# Patient Record
Sex: Female | Born: 1995 | Race: Black or African American | Hispanic: No | Marital: Single | Smoking: Current some day smoker
Health system: Southern US, Community
[De-identification: ages and names within clinical notes are randomized; demographics above are authoritative.]

## PROBLEM LIST (undated history)

## (undated) ENCOUNTER — Inpatient Hospital Stay (HOSPITAL_COMMUNITY): Payer: Self-pay

## (undated) DIAGNOSIS — N760 Acute vaginitis: Secondary | ICD-10-CM

## (undated) DIAGNOSIS — B9689 Other specified bacterial agents as the cause of diseases classified elsewhere: Secondary | ICD-10-CM

## (undated) DIAGNOSIS — Z8619 Personal history of other infectious and parasitic diseases: Secondary | ICD-10-CM

## (undated) HISTORY — PX: WISDOM TOOTH EXTRACTION: SHX21

---
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
## 2014-05-13 ENCOUNTER — Encounter (HOSPITAL_COMMUNITY): Payer: Self-pay

## 2014-05-13 ENCOUNTER — Inpatient Hospital Stay (HOSPITAL_COMMUNITY): Payer: BC Managed Care – PPO

## 2014-05-13 ENCOUNTER — Inpatient Hospital Stay (HOSPITAL_COMMUNITY)
Admission: AD | Admit: 2014-05-13 | Discharge: 2014-05-13 | Disposition: A | Discharge status: Home / Self Care | Payer: BC Managed Care – PPO | Source: Ambulatory Visit | Attending: Obstetrics and Gynecology | Admitting: Obstetrics and Gynecology

## 2014-05-13 DIAGNOSIS — O074 Failed attempted termination of pregnancy without complication: Secondary | ICD-10-CM | POA: Diagnosis not present

## 2014-05-13 DIAGNOSIS — N912 Amenorrhea, unspecified: Secondary | ICD-10-CM | POA: Diagnosis present

## 2014-05-13 DIAGNOSIS — Z3492 Encounter for supervision of normal pregnancy, unspecified, second trimester: Secondary | ICD-10-CM

## 2014-05-13 DIAGNOSIS — F17200 Nicotine dependence, unspecified, uncomplicated: Secondary | ICD-10-CM | POA: Insufficient documentation

## 2014-05-13 DIAGNOSIS — Z9889 Other specified postprocedural states: Secondary | ICD-10-CM

## 2014-05-13 LAB — HIV ANTIBODY (ROUTINE TESTING W REFLEX): HIV: NONREACTIVE

## 2014-05-13 LAB — WET PREP, GENITAL
Clue Cells Wet Prep HPF POC: NONE SEEN
TRICH WET PREP: NONE SEEN
YEAST WET PREP: NONE SEEN

## 2014-05-13 LAB — POCT PREGNANCY, URINE: Preg Test, Ur: POSITIVE — AB

## 2014-05-13 LAB — HCG, QUANTITATIVE, PREGNANCY: HCG, BETA CHAIN, QUANT, S: 48102 m[IU]/mL — AB (ref <5)

## 2014-05-13 LAB — RPR

## 2014-05-13 NOTE — MAU Note (Signed)
Pt thinks her TAB in July was unsuccessful and that this is the same pregnancy.

## 2014-05-13 NOTE — MAU Provider Note (Signed)
CSN: [id number]     Arrival date & time 05/13/14  1130 History   None    Chief Complaint  Patient presents with  . abortion complication      (Consider location/radiation/quality/duration/timing/severity/associated sxs/prior Treatment) The history is provided by the patient.   Alison Jensen is a 18 y.o. G2P0010 who presents to the ED with amenorrhea. She states that she went to an abortion clinic in [facility] July 17 and had a TAB when she was [redacted] weeks pregnant. She had bleeding after that x 2 days but has had nothing since then. She reports being sexually active since the procedure. She started OC's about 4 weeks after the procedure but only took them for a week. She decided to do a pregnancy test last week and it was positive. She went back to the abortion clinic last Friday after she had the positive pregnancy and they told her she could not continue with this pregnancy and scheduled her for a repeat TAB for tomorrow. The patient states she is confused about what they found and wants to know what is going on.  Current sex partner x 4 years. Hx of Chlamydia 2 years ago and then again this year. Last pap smear 8 months ago and was normal.   Past Medical History  Diagnosis Date  . BV (bacterial vaginosis)   . Medical history non-contributory    Past Surgical History  Procedure Laterality Date  . No past surgeries     History reviewed. No pertinent family history. History  Substance Use Topics  . Smoking status: Current Some Day Smoker -- 0.50 packs/day for 4 years    Types: Cigarettes  . Smokeless tobacco: Never Used  . Alcohol Use: No   OB History   Grav Para Term Preterm Abortions TAB SAB Ect Mult Living   Review of Systems  Constitutional: Negative for fever and chills.  HENT: Negative.   Eyes: Negative for visual disturbance.  Respiratory: Negative for cough and shortness of breath.   Cardiovascular: Negative for chest pain.  Gastrointestinal: Positive for  nausea and vomiting. Negative for abdominal pain.  Genitourinary: Positive for frequency. Negative for dysuria, urgency and pelvic pain.  Musculoskeletal: Negative for back pain.  Skin: Negative for rash.  Neurological: Negative for syncope and headaches.  Psychiatric/Behavioral: Negative for confusion. The patient is not nervous/anxious.     Allergies  Review of patient's allergies indicates no known allergies.  Home Medications   Prior to Admission medications   Not on File   BP 121/73  Pulse 85  Temp(Src) 98.5 F (36.9 C) (Oral)  Resp 16  Ht  (1.651 m)  Wt 194 lb 3.2 oz (88.089 kg)  BMI 32.32 kg/m2 Physical Exam  Nursing note and vitals reviewed. Constitutional: She is oriented to person, place, and time. She appears well-developed and well-nourished.  HENT:  Head: Normocephalic.  Eyes: EOM are normal.  Neck: Neck supple.  Cardiovascular: Normal rate.   Pulmonary/Chest: Effort normal.  Abdominal: Soft. There is no tenderness.  Genitourinary:  External genitalia without lesions. Mucous discharge vaginal vault. Cervix long, closed, no CMT, no adnexal tenderness, uterus approximately 12 week size.   Musculoskeletal: Normal range of motion.  Neurological: She is alert and oriented to person, place, and time. No cranial nerve deficit.  Skin: Skin is warm and dry.  Psychiatric: She has a normal mood and affect.       CLINICAL  DATA: Positive fetal heart tones. TAB in July. Gravida 2  para 1.  EXAM:  OBSTETRIC <14 WK ULTRASOUND  TECHNIQUE:  Transabdominal ultrasound was performed for evaluation of the  gestation as well as the maternal uterus and adnexal regions.  COMPARISON: None.  FINDINGS:  Intrauterine gestational sac: Present  Yolk sac: Not seen  Embryo: Present  Cardiac Activity: Present  Heart Rate: 141 bpm  CRL: 77.1 mm 13 w 5d [place] EDC: 11/13/2014  Maternal uterus/adnexae: No subchorionic hemorrhage identified. The  ovaries are not seen. No adnexal  mass or free pelvic fluid.  IMPRESSION:  1. Single living intrauterine embryo at 13 weeks 5 days by  ultrasound.  2. By today's exam EDC is 11/13/2014.  Electronically Signed  By: Rosalie Gums M.D.  On: 05/13/2014 15:41     ED Course  Procedures (including critical care time) Labs Review Labs Reviewed  WET PREP, GENITAL - Abnormal; Notable for the following:    WBC, Wet Prep HPF POC FEW (*)    All other components within normal limits  HCG, QUANTITATIVE, PREGNANCY - Abnormal; Notable for the following:    hCG, Beta Chain, Quant, S 48102 (*)    All other components within normal limits  POCT PREGNANCY, URINE - Abnormal; Notable for the following:    Preg Test, Ur POSITIVE (*)    All other components within normal limits  GC/CHLAMYDIA PROBE AMP  HIV ANTIBODY (ROUTINE TESTING)  RPR    MDM  18 y.o. female with amenorrhea s/p TAB in July with positive FHT's today. Ultrasound pending.  Care turned over to Jeani Sow, NP.  ASSESSMENT:  Viable Intrauterine Pregnancy [redacted]w[redacted]d gestation                             Post unsuccessful TAB  PLAN:  U/S report reviewed with the patient              Instructed to take Prenatal Vitamins qd and begin prenatal care with MD of her choice

## 2014-05-13 NOTE — MAU Note (Signed)
Pt states was 6.5 weeks when she had TAB 03/18/2014. Never had menstrual cycle since TAB. Took home pregnancy test and went to pregnancy clinic, then went back to abortion clinic and was told she is still pregnant with same pregnancy. Scheduled to go tomorrow to repeat procedure, however is unsure if clinic is telling her correct information.

## 2014-05-13 NOTE — MAU Note (Signed)
TAB on 7/17, no period since then.  Pos HPT & UPT [facility].  Pt is scheduled tomorrow for repeat TAB at same abortion center.  Pt denies pain or bleeding.

## 2014-05-13 NOTE — Discharge Instructions (Signed)
Second Trimester of Pregnancy The second trimester is from week 13 through week 28, month 4 through 6. This is often the time in pregnancy that you feel your best. Often times, morning sickness has lessened or quit. You may have more energy, and you may get hungry more often. Your unborn baby (fetus) is growing rapidly. At the end of the sixth month, he or she is about 9 inches long and weighs about 1 pounds. You will likely feel the baby move (quickening) between 18 and 20 weeks of pregnancy. HOME CARE   Avoid all smoking, herbs, and alcohol. Avoid drugs not approved by your doctor.  Only take medicine as told by your doctor. Some medicines are safe and some are not during pregnancy.  Exercise only as told by your doctor. Stop exercising if you start having cramps.  Eat regular, healthy meals.  Wear a good support bra if your breasts are tender.  Do not use hot tubs, steam rooms, or saunas.  Wear your seat belt when driving.  Avoid raw meat, uncooked cheese, and liter boxes and soil used by cats.  Take your prenatal vitamins.  Try taking medicine that helps you poop (stool softener) as needed, and if your doctor approves. Eat more fiber by eating fresh fruit, vegetables, and whole grains. Drink enough fluids to keep your pee (urine) clear or pale yellow.  Take warm water baths (sitz baths) to soothe pain or discomfort caused by hemorrhoids. Use hemorrhoid cream if your doctor approves.  If you have puffy, bulging veins (varicose veins), wear support hose. Raise (elevate) your feet for 15 minutes, 3-4 times a day. Limit salt in your diet.  Avoid heavy lifting, wear low heals, and sit up straight.  Rest with your legs raised if you have leg cramps or low back pain.  Visit your dentist if you have not gone during your pregnancy. Use a soft toothbrush to brush your teeth. Be gentle when you floss.  You can have sex (intercourse) unless your doctor tells you not to.  Go to your  doctor visits. GET HELP IF:   You feel dizzy.  You have mild cramps or pressure in your lower belly (abdomen).  You have a nagging pain in your belly area.  You continue to feel sick to your stomach (nauseous), throw up (vomit), or have watery poop (diarrhea).  You have bad smelling fluid coming from your vagina.  You have pain with peeing (urination). GET HELP RIGHT AWAY IF:   You have a fever.  You are leaking fluid from your vagina.  You have spotting or bleeding from your vagina.  You have severe belly cramping or pain.  You lose or gain weight rapidly.  You have trouble catching your breath and have chest pain.  You notice sudden or extreme puffiness (swelling) of your face, hands, ankles, feet, or legs.  You have not felt the baby move in over an hour.  You have severe headaches that do not go away with medicine.  You have vision changes. Document Released: 11/13/2009 Document Revised: 12/14/2012 Document Reviewed: 10/20/2012 Exit[employer] Patient Information 2015 [employer]. This information is not intended to replace advice given to you by your health care provider. Make sure you discuss any questions you have with your health care provider.  

## 2014-05-14 LAB — GC/CHLAMYDIA PROBE AMP
CT Probe RNA: NEGATIVE
GC PROBE AMP APTIMA: NEGATIVE

## 2014-05-17 NOTE — MAU Provider Note (Signed)
Attestation of Attending Supervision of Advanced Practitioner (CNM/NP): Evaluation and management procedures were performed by the Advanced Practitioner under my supervision and collaboration.  I have reviewed the Advanced Practitioner's note and chart, and I agree with the management and plan.  Lawrencia Mauney 05/17/2014 9:29 AM

## 2014-07-04 ENCOUNTER — Encounter (HOSPITAL_COMMUNITY): Payer: Self-pay

## 2014-09-29 ENCOUNTER — Emergency Department (HOSPITAL_COMMUNITY): Payer: BLUE CROSS/BLUE SHIELD

## 2014-09-29 ENCOUNTER — Emergency Department (HOSPITAL_COMMUNITY)
Admission: EM | Admit: 2014-09-29 | Discharge: 2014-09-29 | Disposition: A | Discharge status: Home / Self Care | Payer: BLUE CROSS/BLUE SHIELD | Attending: Emergency Medicine | Admitting: Emergency Medicine

## 2014-09-29 ENCOUNTER — Encounter (HOSPITAL_COMMUNITY): Payer: Self-pay

## 2014-09-29 DIAGNOSIS — M25571 Pain in right ankle and joints of right foot: Secondary | ICD-10-CM | POA: Diagnosis not present

## 2014-09-29 DIAGNOSIS — R000 Tachycardia, unspecified: Secondary | ICD-10-CM | POA: Diagnosis not present

## 2014-09-29 DIAGNOSIS — M722 Plantar fascial fibromatosis: Secondary | ICD-10-CM | POA: Diagnosis not present

## 2014-09-29 DIAGNOSIS — Z720 Tobacco use: Secondary | ICD-10-CM | POA: Insufficient documentation

## 2014-09-29 DIAGNOSIS — M2141 Flat foot [pes planus] (acquired), right foot: Secondary | ICD-10-CM | POA: Insufficient documentation

## 2014-09-29 DIAGNOSIS — Z8742 Personal history of other diseases of the female genital tract: Secondary | ICD-10-CM | POA: Diagnosis not present

## 2014-09-29 DIAGNOSIS — R2241 Localized swelling, mass and lump, right lower limb: Secondary | ICD-10-CM | POA: Diagnosis present

## 2014-09-29 MED ORDER — NAPROXEN 375 MG PO TABS: 375.0000 mg | ORAL_TABLET | Freq: Two times a day (BID) | ORAL | Status: DC

## 2014-09-29 MED ORDER — IBUPROFEN 400 MG PO TABS
800.0000 mg | ORAL_TABLET | Freq: Once | ORAL | Status: AC
  Administered 2014-09-29: 800 mg via ORAL
  Filled 2014-09-29: qty 2
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
## 2014-09-29 NOTE — ED Notes (Signed)
Pt c/o R foot pain since this morning; denies injury. Pain increases when bearing weight. Reports wearing "baby doll shoes" to a meeting this morning and noticed some discomfort to R foot. Swelling noted to R foot and ankle

## 2014-09-29 NOTE — Discharge Instructions (Signed)
Plantar Fasciitis (Heel Spur Syndrome) with Rehab The plantar fascia is a fibrous, ligament-like, soft-tissue structure that spans the bottom of the foot. Plantar fasciitis is a condition that causes pain in the foot due to inflammation of the tissue. SYMPTOMS   Pain and tenderness on the underneath side of the foot.  Pain that worsens with standing or walking. CAUSES  Plantar fasciitis is caused by irritation and injury to the plantar fascia on the underneath side of the foot. Common mechanisms of injury include:  Direct trauma to bottom of the foot.  Damage to a small nerve that runs under the foot where the main fascia attaches to the heel bone.  Stress placed on the plantar fascia due to bone spurs. RISK INCREASES WITH:   Activities that place stress on the plantar fascia (running, jumping, pivoting, or cutting).  Poor strength and flexibility.  Improperly fitted shoes.  Tight calf muscles.  Flat feet.  Failure to warm-up properly before activity.  Obesity. PREVENTION  Warm up and stretch properly before activity.  Allow for adequate recovery between workouts.  Maintain physical fitness:  Strength, flexibility, and endurance.  Cardiovascular fitness.  Maintain a health body weight.  Avoid stress on the plantar fascia.  Wear properly fitted shoes, including arch supports for individuals who have flat feet. PROGNOSIS  If treated properly, then the symptoms of plantar fasciitis usually resolve without surgery. However, occasionally surgery is necessary. RELATED COMPLICATIONS   Recurrent symptoms that may result in a chronic condition.  Problems of the lower back that are caused by compensating for the injury, such as limping.  Pain or weakness of the foot during push-off following surgery.  Chronic inflammation, scarring, and partial or complete fascia tear, occurring more often from repeated injections. TREATMENT  Treatment initially involves the use of  ice and medication to help reduce pain and inflammation. The use of strengthening and stretching exercises may help reduce pain with activity, especially stretches of the Achilles tendon. These exercises may be performed at home or with a therapist. Your caregiver may recommend that you use heel cups of arch supports to help reduce stress on the plantar fascia. Occasionally, corticosteroid injections are given to reduce inflammation. If symptoms persist for greater than 6 months despite non-surgical (conservative), then surgery may be recommended.  MEDICATION   If pain medication is necessary, then nonsteroidal anti-inflammatory medications, such as aspirin and ibuprofen, or other minor pain relievers, such as acetaminophen, are often recommended.  Do not take pain medication within 7 days before surgery.  Prescription pain relievers may be given if deemed necessary by your caregiver. Use only as directed and only as much as you need.  Corticosteroid injections may be given by your caregiver. These injections should be reserved for the most serious cases, because they may only be given a certain number of times. HEAT AND COLD  Cold treatment (icing) relieves pain and reduces inflammation. Cold treatment should be applied for 10 to 15 minutes every 2 to 3 hours for inflammation and pain and immediately after any activity that aggravates your symptoms. Use ice packs or massage the area with a piece of ice (ice massage).  Heat treatment may be used prior to performing the stretching and strengthening activities prescribed by your caregiver, physical therapist, or athletic trainer. Use a heat pack or soak the injury in warm water. SEEK IMMEDIATE MEDICAL CARE IF:  Treatment seems to offer no benefit, or the condition worsens.  Any medications produce adverse side effects. EXERCISES RANGE   OF MOTION (ROM) AND STRETCHING EXERCISES - Plantar Fasciitis (Heel Spur Syndrome) These exercises may help you  when beginning to rehabilitate your injury. Your symptoms may resolve with or without further involvement from your physician, physical therapist or athletic trainer. While completing these exercises, remember:   Restoring tissue flexibility helps normal motion to return to the joints. This allows healthier, less painful movement and activity.  An effective stretch should be held for at least 30 seconds.  A stretch should never be painful. You should only feel a gentle lengthening or release in the stretched tissue. RANGE OF MOTION - Toe Extension, Flexion  Sit with your right / left leg crossed over your opposite knee.  Grasp your toes and gently pull them back toward the top of your foot. You should feel a stretch on the bottom of your toes and/or foot.  Hold this stretch for __________ seconds.  Now, gently pull your toes toward the bottom of your foot. You should feel a stretch on the top of your toes and or foot.  Hold this stretch for __________ seconds. Repeat __________ times. Complete this stretch __________ times per day.  RANGE OF MOTION - Ankle Dorsiflexion, Active Assisted  Remove shoes and sit on a chair that is preferably not on a carpeted surface.  Place right / left foot under knee. Extend your opposite leg for support.  Keeping your heel down, slide your right / left foot back toward the chair until you feel a stretch at your ankle or calf. If you do not feel a stretch, slide your bottom forward to the edge of the chair, while still keeping your heel down.  Hold this stretch for __________ seconds. Repeat __________ times. Complete this stretch __________ times per day.  STRETCH - Gastroc, Standing  Place hands on wall.  Extend right / left leg, keeping the front knee somewhat bent.  Slightly point your toes inward on your back foot.  Keeping your right / left heel on the floor and your knee straight, shift your weight toward the wall, not allowing your back to  arch.  You should feel a gentle stretch in the right / left calf. Hold this position for __________ seconds. Repeat __________ times. Complete this stretch __________ times per day. STRETCH - Soleus, Standing  Place hands on wall.  Extend right / left leg, keeping the other knee somewhat bent.  Slightly point your toes inward on your back foot.  Keep your right / left heel on the floor, bend your back knee, and slightly shift your weight over the back leg so that you feel a gentle stretch deep in your back calf.  Hold this position for __________ seconds. Repeat __________ times. Complete this stretch __________ times per day. STRETCH - Gastrocsoleus, Standing  Note: This exercise can place a lot of stress on your foot and ankle. Please complete this exercise only if specifically instructed by your caregiver.   Place the ball of your right / left foot on a step, keeping your other foot firmly on the same step.  Hold on to the wall or a rail for balance.  Slowly lift your other foot, allowing your body weight to press your heel down over the edge of the step.  You should feel a stretch in your right / left calf.  Hold this position for __________ seconds.  Repeat this exercise with a slight bend in your right / left knee. Repeat __________ times. Complete this stretch __________ times per day.    STRENGTHENING EXERCISES - Plantar Fasciitis (Heel Spur Syndrome)  These exercises may help you when beginning to rehabilitate your injury. They may resolve your symptoms with or without further involvement from your physician, physical therapist or athletic trainer. While completing these exercises, remember:   Muscles can gain both the endurance and the strength needed for everyday activities through controlled exercises.  Complete these exercises as instructed by your physician, physical therapist or athletic trainer. Progress the resistance and repetitions only as guided. STRENGTH -  Towel Curls  Sit in a chair positioned on a non-carpeted surface.  Place your foot on a towel, keeping your heel on the floor.  Pull the towel toward your heel by only curling your toes. Keep your heel on the floor.  If instructed by your physician, physical therapist or athletic trainer, add ____________________ at the end of the towel. Repeat __________ times. Complete this exercise __________ times per day. STRENGTH - Ankle Inversion  Secure one end of a rubber exercise band/tubing to a fixed object (table, pole). Loop the other end around your foot just before your toes.  Place your fists between your knees. This will focus your strengthening at your ankle.  Slowly, pull your big toe up and in, making sure the band/tubing is positioned to resist the entire motion.  Hold this position for __________ seconds.  Have your muscles resist the band/tubing as it slowly pulls your foot back to the starting position. Repeat __________ times. Complete this exercises __________ times per day.  Document Released: 08/19/2005 Document Revised: 11/11/2011 Document Reviewed: 12/01/2008 [employer] Patient Information 2015 [employer]. This information is not intended to replace advice given to you by your health care provider. Make sure you discuss any questions you have with your health care provider.  

## 2014-09-29 NOTE — ED Notes (Signed)
Pt reports swelling to right foot.  Denies any injury to foot today.  Sts swelling started today.

## 2014-10-07 ENCOUNTER — Emergency Department (HOSPITAL_COMMUNITY)
Admission: EM | Admit: 2014-10-07 | Discharge: 2014-10-08 | Discharge status: Against Medical Advice | Payer: BLUE CROSS/BLUE SHIELD | Attending: Emergency Medicine | Admitting: Emergency Medicine

## 2014-10-07 ENCOUNTER — Encounter (HOSPITAL_COMMUNITY): Payer: Self-pay | Admitting: *Deleted

## 2014-10-07 DIAGNOSIS — R05 Cough: Secondary | ICD-10-CM | POA: Diagnosis not present

## 2014-10-07 DIAGNOSIS — Z720 Tobacco use: Secondary | ICD-10-CM | POA: Insufficient documentation

## 2014-10-07 NOTE — ED Notes (Signed)
Triage staff unable to locate pt to bring to Pod E treatment room.  X-ray has been unable to locate pt for chest x-ray.  Charge RN called pt's phone number and pt states she went home and doesn't want to be seen.

## 2014-10-07 NOTE — ED Notes (Signed)
The pt woke up this am with a cold and cough every time she smokes her cig she feels like her heart is not pumpoing enough blood.  No distress.  lmp feb 3rd. No temp

## 2014-10-09 ENCOUNTER — Emergency Department (HOSPITAL_COMMUNITY): Payer: BLUE CROSS/BLUE SHIELD

## 2014-10-09 ENCOUNTER — Encounter (HOSPITAL_COMMUNITY): Payer: Self-pay | Admitting: Emergency Medicine

## 2014-10-09 ENCOUNTER — Emergency Department (HOSPITAL_COMMUNITY)
Admission: EM | Admit: 2014-10-09 | Discharge: 2014-10-09 | Disposition: A | Discharge status: Home / Self Care | Payer: BLUE CROSS/BLUE SHIELD | Attending: Emergency Medicine | Admitting: Emergency Medicine

## 2014-10-09 DIAGNOSIS — Z8619 Personal history of other infectious and parasitic diseases: Secondary | ICD-10-CM | POA: Diagnosis not present

## 2014-10-09 DIAGNOSIS — R079 Chest pain, unspecified: Secondary | ICD-10-CM | POA: Insufficient documentation

## 2014-10-09 DIAGNOSIS — Z720 Tobacco use: Secondary | ICD-10-CM | POA: Diagnosis not present

## 2014-10-09 DIAGNOSIS — Z87448 Personal history of other diseases of urinary system: Secondary | ICD-10-CM | POA: Insufficient documentation

## 2014-10-09 DIAGNOSIS — Z792 Long term (current) use of antibiotics: Secondary | ICD-10-CM | POA: Insufficient documentation

## 2014-10-09 DIAGNOSIS — Z791 Long term (current) use of non-steroidal anti-inflammatories (NSAID): Secondary | ICD-10-CM | POA: Insufficient documentation

## 2014-10-09 DIAGNOSIS — J189 Pneumonia, unspecified organism: Secondary | ICD-10-CM

## 2014-10-09 DIAGNOSIS — R05 Cough: Secondary | ICD-10-CM | POA: Diagnosis present

## 2014-10-09 DIAGNOSIS — J159 Unspecified bacterial pneumonia: Secondary | ICD-10-CM | POA: Diagnosis not present

## 2014-10-09 LAB — URINALYSIS, ROUTINE W REFLEX MICROSCOPIC
Glucose, UA: NEGATIVE mg/dL
HGB URINE DIPSTICK: NEGATIVE
Ketones, ur: 15 mg/dL — AB
Nitrite: NEGATIVE
PH: 7 (ref 5.0–8.0)
Protein, ur: NEGATIVE mg/dL
Specific Gravity, Urine: 1.038 — ABNORMAL HIGH (ref 1.005–1.030)

## 2014-10-09 LAB — URINE MICROSCOPIC-ADD ON

## 2014-10-09 LAB — POC URINE PREG, ED: Preg Test, Ur: NEGATIVE

## 2014-10-09 MED ORDER — ALBUTEROL SULFATE HFA 108 (90 BASE) MCG/ACT IN AERS
2.0000 | INHALATION_SPRAY | Freq: Once | RESPIRATORY_TRACT | Status: AC
  Administered 2014-10-09: 2 via RESPIRATORY_TRACT
  Filled 2014-10-09: qty 6.7

## 2014-10-09 MED ORDER — IPRATROPIUM-ALBUTEROL 0.5-2.5 (3) MG/3ML IN SOLN
3.0000 mL | Freq: Once | RESPIRATORY_TRACT | Status: AC
  Administered 2014-10-09: 3 mL via RESPIRATORY_TRACT
  Filled 2014-10-09: qty 3

## 2014-10-09 MED ORDER — AZITHROMYCIN 250 MG PO TABS: ORAL_TABLET | ORAL | Status: DC

## 2014-10-09 MED ORDER — DM-GUAIFENESIN ER 30-600 MG PO TB12: 1.0000 | ORAL_TABLET | Freq: Two times a day (BID) | ORAL | Status: DC

## 2014-10-09 NOTE — ED Notes (Signed)
Pt here with c/o of chest pain that started last night. SOB with cough upon exertion. States that "it hurts to breath". Nausea, vomiting yesterday. Heavy smoker.

## 2014-10-09 NOTE — Discharge Instructions (Signed)
Pneumonia °Pneumonia is an infection of the lungs.  °CAUSES °Pneumonia may be caused by bacteria or a virus. Usually, these infections are caused by breathing infectious particles into the lungs (respiratory tract). °SIGNS AND SYMPTOMS  °· Cough. °· Fever. °· Chest pain. °· Increased rate of breathing. °· Wheezing. °· Mucus production. °DIAGNOSIS  °If you have the common symptoms of pneumonia, your health care provider will typically confirm the diagnosis with a chest X-ray. The X-ray will show an abnormality in the lung (pulmonary infiltrate) if you have pneumonia. Other tests of your blood, urine, or sputum may be done to find the specific cause of your pneumonia. Your health care provider may also do tests (blood gases or pulse oximetry) to see how well your lungs are working. °TREATMENT  °Some forms of pneumonia may be spread to other people when you cough or sneeze. You may be asked to wear a mask before and during your exam. Pneumonia that is caused by bacteria is treated with antibiotic medicine. Pneumonia that is caused by the influenza virus may be treated with an antiviral medicine. Most other viral infections must run their course. These infections will not respond to antibiotics.  °HOME CARE INSTRUCTIONS  °· Cough suppressants may be used if you are losing too much rest. However, coughing protects you by clearing your lungs. You should avoid using cough suppressants if you can. °· Your health care provider may have prescribed medicine if he or she thinks your pneumonia is caused by bacteria or influenza. Finish your medicine even if you start to feel better. °· Your health care provider may also prescribe an expectorant. This loosens the mucus to be coughed up. °· Take medicines only as directed by your health care provider. °· Do not smoke. Smoking is a common cause of bronchitis and can contribute to pneumonia. If you are a smoker and continue to smoke, your cough may last several weeks after your  pneumonia has cleared. °· A cold steam vaporizer or humidifier in your room or home may help loosen mucus. °· Coughing is often worse at night. Sleeping in a semi-upright position in a recliner or using a couple pillows under your head will help with this. °· Get rest as you feel it is needed. Your body will usually let you know when you need to rest. °PREVENTION °A pneumococcal shot (vaccine) is available to prevent a common bacterial cause of pneumonia. This is usually suggested for: °· People over 65 years old. °· Patients on chemotherapy. °· People with chronic lung problems, such as bronchitis or emphysema. °· People with immune system problems. °If you are over 65 or have a high risk condition, you may receive the pneumococcal vaccine if you have not received it before. In some countries, a routine influenza vaccine is also recommended. This vaccine can help prevent some cases of pneumonia. You may be offered the influenza vaccine as part of your care. °If you smoke, it is time to quit. You may receive instructions on how to stop smoking. Your health care provider can provide medicines and counseling to help you quit. °SEEK MEDICAL CARE IF: °You have a fever. °SEEK IMMEDIATE MEDICAL CARE IF:  °· Your illness becomes worse. This is especially true if you are elderly or weakened from any other disease. °· You cannot control your cough with suppressants and are losing sleep. °· You begin coughing up blood. °· You develop pain which is getting worse or is uncontrolled with medicines. °· Any of the symptoms   which initially brought you in for treatment are getting worse rather than better. °· You develop shortness of breath or chest pain. °MAKE SURE YOU:  °· Understand these instructions. °· Will watch your condition. °· Will get help right away if you are not doing well or get worse. °Document Released: 08/19/2005 Document Revised: 01/03/2014 Document Reviewed: 11/08/2010 °[employer]® Patient Information ©2015  [employer]. This information is not intended to replace advice given to you by your health care provider. Make sure you discuss any questions you have with your health care provider. ° °Smoking Cessation °Quitting smoking is important to your health and has many advantages. However, it is not always easy to quit since nicotine is a very addictive drug. Oftentimes, people try 3 times or more before being able to quit. This document explains the best ways for you to prepare to quit smoking. Quitting takes hard work and a lot of effort, but you can do it. °ADVANTAGES OF QUITTING SMOKING °· You will live longer, feel better, and live better. °· Your body will feel the impact of quitting smoking almost immediately. °¨ Within 20 minutes, blood pressure decreases. Your pulse returns to its normal level. °¨ After 8 hours, carbon monoxide levels in the blood return to normal. Your oxygen level increases. °¨ After 24 hours, the chance of having a heart attack starts to decrease. Your breath, hair, and body stop smelling like smoke. °¨ After 48 hours, damaged nerve endings begin to recover. Your sense of taste and smell improve. °¨ After 72 hours, the body is virtually free of nicotine. Your bronchial tubes relax and breathing becomes easier. °¨ After 2 to 12 weeks, lungs can hold more air. Exercise becomes easier and circulation improves. °· The risk of having a heart attack, stroke, cancer, or lung disease is greatly reduced. °¨ After 1 year, the risk of coronary heart disease is cut in half. °¨ After 5 years, the risk of stroke falls to the same as a nonsmoker. °¨ After 10 years, the risk of lung cancer is cut in half and the risk of other cancers decreases significantly. °¨ After 15 years, the risk of coronary heart disease drops, usually to the level of a nonsmoker. °· If you are pregnant, quitting smoking will improve your chances of having a healthy baby. °· The people you live with, especially any children, will be  healthier. °· You will have extra money to spend on things other than cigarettes. °QUESTIONS TO THINK ABOUT BEFORE ATTEMPTING TO QUIT °You may want to talk about your answers with your health care provider. °· Why do you want to quit? °· If you tried to quit in the past, what helped and what did not? °· What will be the most difficult situations for you after you quit? How will you plan to handle them? °· Who can help you through the tough times? Your family? Friends? A health care provider? °· What pleasures do you get from smoking? What ways can you still get pleasure if you quit? °Here are some questions to ask your health care provider: °· How can you help me to be successful at quitting? °· What medicine do you think would be best for me and how should I take it? °· What should I do if I need more help? °· What is smoking withdrawal like? How can I get information on withdrawal? °GET READY °· Set a quit date. °· Change your environment by getting rid of all cigarettes, ashtrays, matches, and   lighters in your home, car, or work. Do not let people smoke in your home. °· Review your past attempts to quit. Think about what worked and what did not. °GET SUPPORT AND ENCOURAGEMENT °You have a better chance of being successful if you have help. You can get support in many ways. °· Tell your family, friends, and coworkers that you are going to quit and need their support. Ask them not to smoke around you. °· Get individual, group, or telephone counseling and support. Programs are available at local hospitals and health centers. Call your local health department for information about programs in your area. °· Spiritual beliefs and practices may help some smokers quit. °· Download a "quit meter" on your computer to keep track of quit statistics, such as how long you have gone without smoking, cigarettes not smoked, and money saved. °· Get a self-help book about quitting smoking and staying off tobacco. °LEARN NEW SKILLS  AND BEHAVIORS °· Distract yourself from urges to smoke. Talk to someone, go for a walk, or occupy your time with a task. °· Change your normal routine. Take a different route to work. Drink tea instead of coffee. Eat breakfast in a different place. °· Reduce your stress. Take a hot bath, exercise, or read a book. °· Plan something enjoyable to do every day. Reward yourself for not smoking. °· Explore interactive web-based programs that specialize in helping you quit. °GET MEDICINE AND USE IT CORRECTLY °Medicines can help you stop smoking and decrease the urge to smoke. Combining medicine with the above behavioral methods and support can greatly increase your chances of successfully quitting smoking. °· Nicotine replacement therapy helps deliver nicotine to your body without the negative effects and risks of smoking. Nicotine replacement therapy includes nicotine gum, lozenges, inhalers, nasal sprays, and skin patches. Some may be available over-the-counter and others require a prescription. °· Antidepressant medicine helps people abstain from smoking, but how this works is unknown. This medicine is available by prescription. °· Nicotinic receptor partial agonist medicine simulates the effect of nicotine in your brain. This medicine is available by prescription. °Ask your health care provider for advice about which medicines to use and how to use them based on your health history. Your health care provider will tell you what side effects to look out for if you choose to be on a medicine or therapy. Carefully read the information on the package. Do not use any other product containing nicotine while using a nicotine replacement product.  °RELAPSE OR DIFFICULT SITUATIONS °Most relapses occur within the first 3 months after quitting. Do not be discouraged if you start smoking again. Remember, most people try several times before finally quitting. You may have symptoms of withdrawal because your body is used to nicotine.  You may crave cigarettes, be irritable, feel very hungry, cough often, get headaches, or have difficulty concentrating. The withdrawal symptoms are only temporary. They are strongest when you first quit, but they will go away within 10-14 days. °To reduce the chances of relapse, try to: °· Avoid drinking alcohol. Drinking lowers your chances of successfully quitting. °· Reduce the amount of caffeine you consume. Once you quit smoking, the amount of caffeine in your body increases and can give you symptoms, such as a rapid heartbeat, sweating, and anxiety. °· Avoid smokers because they can make you want to smoke. °· Do not let weight gain distract you. Many smokers will gain weight when they quit, usually less than 10 pounds. Eat a healthy   diet and stay active. You can always lose the weight gained after you quit. °· Find ways to improve your mood other than smoking. °FOR MORE INFORMATION  °www.smokefree.gov  °Document Released: 08/13/2001 Document Revised: 01/03/2014 Document Reviewed: 11/28/2011 °[employer]® Patient Information ©2015 [employer]. This information is not intended to replace advice given to you by your health care provider. Make sure you discuss any questions you have with your health care provider. ° °

## 2014-10-09 NOTE — ED Provider Notes (Signed)
CSN: [id number]     Arrival date & time 10/09/14  1219 History   First MD Initiated Contact with Patient 10/09/14 1234     Chief Complaint  Patient presents with  . Cough  . Chest Pain  . Nausea     (Consider location/radiation/quality/duration/timing/severity/associated sxs/prior Treatment) Patient is a 19 y.o. female presenting with cough and chest pain. The history is provided by the patient. No language interpreter was used.  Cough Cough characteristics:  Productive Sputum characteristics:  Green and yellow Severity:  Moderate Duration:  1 week Timing:  Constant Progression:  Worsening Chronicity:  New Smoker: yes   Context: upper respiratory infection   Context: not sick contacts   Relieved by:  Nothing Worsened by:  Activity and smoking Ineffective treatments:  None tried Associated symptoms: chest pain, shortness of breath and wheezing   Associated symptoms: no chills, no diaphoresis, no eye discharge, no fever, no headaches, no rhinorrhea and no sore throat   Chest pain:    Quality:  Aching   Severity:  Moderate   Duration:  1 week   Timing:  Intermittent   Progression:  Waxing and waning   Chronicity:  New Chest Pain Associated symptoms: cough and shortness of breath   Associated symptoms: no abdominal pain, no back pain, no diaphoresis, no fatigue, no fever, no headache, no nausea, no numbness, no palpitations, not vomiting and no weakness     Past Medical History  Diagnosis Date  . BV (bacterial vaginosis)   . Medical history non-contributory    Past Surgical History  Procedure Laterality Date  . No past surgeries     No family history on file. History  Substance Use Topics  . Smoking status: Current Some Day Smoker -- 0.50 packs/day for 4 years    Types: Cigarettes  . Smokeless tobacco: Never Used  . Alcohol Use: No   OB History    Gravida Para Term Preterm AB TAB SAB Ectopic Multiple Living   Review of Systems   Constitutional: Negative for fever, chills, diaphoresis, activity change, appetite change and fatigue.  HENT: Negative for congestion, facial swelling, rhinorrhea and sore throat.   Eyes: Negative for photophobia and discharge.  Respiratory: Positive for cough, shortness of breath and wheezing. Negative for chest tightness.   Cardiovascular: Positive for chest pain. Negative for palpitations and leg swelling.  Gastrointestinal: Negative for nausea, vomiting, abdominal pain and diarrhea.  Endocrine: Negative for polydipsia and polyuria.  Genitourinary: Negative for dysuria, frequency, difficulty urinating and pelvic pain.  Musculoskeletal: Negative for back pain, arthralgias, neck pain and neck stiffness.  Skin: Negative for color change and wound.  Allergic/Immunologic: Negative for immunocompromised state.  Neurological: Negative for facial asymmetry, weakness, numbness and headaches.  Hematological: Does not bruise/bleed easily.  Psychiatric/Behavioral: Negative for confusion and agitation.      Allergies  Review of patient's allergies indicates no known allergies.  Home Medications   Prior to Admission medications   Medication Sig Start Date End Date Taking? Authorizing Provider  guaifenesin (ROBITUSSIN) 100 MG/5ML syrup Take 400 mg by mouth 3 (three) times daily as needed for cough.   Yes Historical Provider, MD  norethindrone-ethinyl estradiol (MICROGESTIN,JUNEL,LOESTRIN) 1-20 MG-MCG tablet Take 1 tablet by mouth daily.   Yes Historical Provider, MD  azithromycin (ZITHROMAX Z-PAK) 250 MG tablet 2 po day one, then 1 daily x 4 days 10/09/14   Toy Cookey, MD  dextromethorphan-guaiFENesin [facility] DM)  30-600 MG per 12 hr tablet Take 1 tablet by mouth 2 (two) times daily. 10/09/14   Toy CookeyMegan Docherty, MD  naproxen (NAPROSYN) 375 MG tablet Take 1 tablet (375 mg total) by mouth 2 (two) times daily. Patient not taking: Reported on 10/09/2014 09/29/14   Roxy Horsemanobert Browning, PA-C   BP 108/65 mmHg   Pulse 75  Temp(Src) 98 F (36.7 C) (Oral)  Resp 18  SpO2 99%  LMP 10/05/2014 Physical Exam  Constitutional: She is oriented to person, place, and time. She appears well-developed and well-nourished. No distress.  HENT:  Head: Normocephalic and atraumatic.  Mouth/Throat: No oropharyngeal exudate.  Eyes: Pupils are equal, round, and reactive to light.  Neck: Normal range of motion. Neck supple.  Cardiovascular: Normal rate, regular rhythm and normal heart sounds.  Exam reveals no gallop and no friction rub.   No murmur heard. Pulmonary/Chest: Effort normal. No respiratory distress. She has wheezes in the right upper field, the right middle field, the right lower field, the left upper field, the left middle field and the left lower field. She has rales in the right middle field, the right lower field, the left middle field and the left lower field.  Abdominal: Soft. Bowel sounds are normal. She exhibits no distension and no mass. There is no tenderness. There is no rebound and no guarding.  Musculoskeletal: Normal range of motion. She exhibits no edema or tenderness.  Neurological: She is alert and oriented to person, place, and time.  Skin: Skin is warm and dry.  Psychiatric: She has a normal mood and affect.    ED Course  Procedures (including critical care time) Labs Review Labs Reviewed  URINALYSIS, ROUTINE W REFLEX MICROSCOPIC - Abnormal; Notable for the following:    Color, Urine ORANGE (*)    APPearance CLOUDY (*)    Specific Gravity, Urine 1.038 (*)    Bilirubin Urine SMALL (*)    Ketones, ur 15 (*)    Urobilinogen, UA >8.0 (*)    Leukocytes, UA SMALL (*)    All other components within normal limits  URINE MICROSCOPIC-ADD ON - Abnormal; Notable for the following:    Squamous Epithelial / LPF MANY (*)    All other components within normal limits  URINE CULTURE  POC URINE PREG, ED    Imaging Review Dg Chest 2 View  10/09/2014   CLINICAL DATA:  Cough and congestion x 3  days. Center/right chest pain. N/v. Non-smoker.  EXAM: CHEST  2 VIEW  COMPARISON:  None.  FINDINGS: Lungs are adequately inflated with patchy upper lobe airspace opacification right worse than left. No evidence of effusion. Cardiomediastinal silhouette and remainder of the exam is unremarkable.  IMPRESSION: Patchy bilateral upper lobe airspace process right worse than left likely a pneumonia.   Electronically Signed   By: Elberta Fortisaniel  Boyle M.D.   On: 10/09/2014 13:34     EKG Interpretation   Date/Time:  Sunday October 09 2014 12:34:11 EST Ventricular Rate:  88 PR Interval:  180 QRS Duration: 81 QT Interval:  354 QTC Calculation: 428 R Axis:   46 Text Interpretation:  Sinus rhythm Consider left atrial enlargement No  significant change since last tracing Confirmed by DOCHERTY  MD, MEGAN  ([id number]) on 10/09/2014 12:35:54 PM      MDM   Final diagnoses:  CAP (community acquired pneumonia)    Pt is a 19 y.o. female with Pmhx as above who presents with about about 1 week of right-sided chest pain described as a pulling, which  is worse with deep breathing, coughing and exertion.  Additionally, she has had about 1 week of a productive cough, shortness of breath and chest congestion.  She's not had fevers, chills.  She has not had leg pain or swelling.  She has no risk factors for PE other than smoking.  On triage vital signs, patient mildly tachycardic, however, is showing a heart rate of 88 on EKG. .  Lungs with scattered wheezing and rales throughout.   Patient feeling much improved after 1 DuoNeb.  Heart rate now persistently elevated.  O2 sats 99, 200% on room air.  Chest x-ray shows patchy bilateral upper lobe airspace processes, right greater than left, suspicious for pneumonia.  Patient will be placed on Z-Pak for community acquired pneumonia and given albuterol MDI for home use   Laniece Hoben evaluation in the Emergency Department is complete. It has been determined that no acute conditions  requiring further emergency intervention are present at this time. The patient/guardian have been advised of the diagnosis and plan. We have discussed signs and symptoms that warrant return to the ED, such as changes or worsening in symptoms, worsening pain, shortness of breath, inability to tolerate liquids      Toy Cookey, MD 10/09/14 1727

## 2014-10-10 ENCOUNTER — Telehealth (HOSPITAL_COMMUNITY): Payer: Self-pay

## 2014-10-11 LAB — URINE CULTURE: Colony Count: 40000

## 2015-04-06 ENCOUNTER — Encounter (HOSPITAL_COMMUNITY): Payer: Self-pay | Admitting: *Deleted

## 2015-04-06 ENCOUNTER — Emergency Department (HOSPITAL_COMMUNITY)
Admission: EM | Admit: 2015-04-06 | Discharge: 2015-04-06 | Disposition: A | Discharge status: Home / Self Care | Payer: BLUE CROSS/BLUE SHIELD | Attending: Emergency Medicine | Admitting: Emergency Medicine

## 2015-04-06 DIAGNOSIS — Z3202 Encounter for pregnancy test, result negative: Secondary | ICD-10-CM | POA: Insufficient documentation

## 2015-04-06 DIAGNOSIS — Z79899 Other long term (current) drug therapy: Secondary | ICD-10-CM | POA: Insufficient documentation

## 2015-04-06 DIAGNOSIS — R2240 Localized swelling, mass and lump, unspecified lower limb: Secondary | ICD-10-CM | POA: Diagnosis present

## 2015-04-06 DIAGNOSIS — Z720 Tobacco use: Secondary | ICD-10-CM | POA: Diagnosis not present

## 2015-04-06 DIAGNOSIS — B373 Candidiasis of vulva and vagina: Secondary | ICD-10-CM | POA: Insufficient documentation

## 2015-04-06 DIAGNOSIS — B3731 Acute candidiasis of vulva and vagina: Secondary | ICD-10-CM

## 2015-04-06 DIAGNOSIS — Z8742 Personal history of other diseases of the female genital tract: Secondary | ICD-10-CM | POA: Insufficient documentation

## 2015-04-06 LAB — WET PREP, GENITAL
Clue Cells Wet Prep HPF POC: NONE SEEN
Trich, Wet Prep: NONE SEEN

## 2015-04-06 LAB — POC URINE PREG, ED: Preg Test, Ur: NEGATIVE

## 2015-04-06 LAB — GC/CHLAMYDIA PROBE AMP (~~LOC~~) NOT AT ARMC
Chlamydia: NEGATIVE
NEISSERIA GONORRHEA: NEGATIVE

## 2015-04-06 MED ORDER — FLUCONAZOLE 100 MG PO TABS
150.0000 mg | ORAL_TABLET | Freq: Once | ORAL | Status: AC
  Administered 2015-04-06: 150 mg via ORAL
  Filled 2015-04-06: qty 2

## 2015-04-06 MED ORDER — FLUCONAZOLE 150 MG PO TABS
150.0000 mg | ORAL_TABLET | Freq: Once | ORAL | Status: DC | PRN
Start: 2015-04-06 — End: 2016-11-15

## 2015-04-06 NOTE — ED Notes (Signed)
The pt reports that she has vaginal swelling for 2 days no known injury.  More swelling tonight.  lmp every 3 months and is due now

## 2015-04-06 NOTE — Discharge Instructions (Signed)

## 2015-04-06 NOTE — ED Notes (Signed)
Pelvic cart setup bedside. 

## 2015-04-06 NOTE — ED Provider Notes (Signed)
CSN: [id number]     Arrival date & time 04/06/15  0110 History   This chart was scribed for Blake Divine, MD by Arlan Organ, ED Scribe. This patient was seen in room A13C/A13C and the patient's care was started 1:53 AM.   Chief Complaint  Patient presents with  . Groin Swelling   The history is provided by the patient. No language interpreter was used.    HPI Comments: Alison Jensen is a 19 y.o. female with a PMHx of bacterial vaginosis who presents to the Emergency Department complaining of constant, ongoing vaginal swelling x 2 days. She also reports burning to the area along with irritation and redness. Discomfort is worsened when walking and when wearing underwear. No alleviating factors at this time. No OTC medications or home remedies attempted prior to arrival. No recent fever, chills, vaginal itching, genital sores, or vaginal bleeding. Ms. Home admits to having unprotected sex 2 days ago and noted symptoms afterwards. She denies any concerns for possible STIs at this time. LNMP 3 months ago-SHe uses Junel birth control. No known allergies to medications.  Past Medical History  Diagnosis Date  . BV (bacterial vaginosis)   . Medical history non-contributory    Past Surgical History  Procedure Laterality Date  . No past surgeries     No family history on file. History  Substance Use Topics  . Smoking status: Current Some Day Smoker -- 0.50 packs/day for 4 years    Types: Cigarettes  . Smokeless tobacco: Never Used  . Alcohol Use: No   OB History    Gravida Para Term Preterm AB TAB SAB Ectopic Multiple Living   2    1 1          Review of Systems  Constitutional: Negative for fever and chills.  Respiratory: Negative for cough and shortness of breath.   Gastrointestinal: Negative for nausea and vomiting.  Genitourinary: Positive for vaginal pain. Negative for dysuria, hematuria, decreased urine volume, vaginal bleeding, vaginal discharge and genital sores.  Neurological:  Negative for headaches.  Psychiatric/Behavioral: Negative for confusion.  All other systems reviewed and are negative.     Allergies  Review of patient's allergies indicates no known allergies.  Home Medications   Prior to Admission medications   Medication Sig Start Date End Date Taking? Authorizing Provider  azithromycin (ZITHROMAX Z-PAK) 250 MG tablet 2 po day one, then 1 daily x 4 days 10/09/14   Toy Cookey, MD  dextromethorphan-guaiFENesin [facility] DM) 30-600 MG per 12 hr tablet Take 1 tablet by mouth 2 (two) times daily. 10/09/14   Toy Cookey, MD  guaifenesin (ROBITUSSIN) 100 MG/5ML syrup Take 400 mg by mouth 3 (three) times daily as needed for cough.    Historical Provider, MD  naproxen (NAPROSYN) 375 MG tablet Take 1 tablet (375 mg total) by mouth 2 (two) times daily. Patient not taking: Reported on 10/09/2014 09/29/14   Roxy Horseman, PA-C  norethindrone-ethinyl estradiol (MICROGESTIN,JUNEL,LOESTRIN) 1-20 MG-MCG tablet Take 1 tablet by mouth daily.    Historical Provider, MD   Triage Vitals: BP 109/61 mmHg  Pulse 88  Temp(Src) 98.3 F (36.8 C) (Oral)  Resp 12  Ht 5\' 5"  (1.651 m)  Wt 187 lb 8 oz (85.049 kg)  BMI 31.20 kg/m2  SpO2 100%  LMP    Physical Exam  Constitutional: She is oriented to person, place, and time. She appears well-developed and well-nourished. No distress.  HENT:  Head: Normocephalic and atraumatic.  Eyes: Conjunctivae are normal. No scleral icterus.  Neck: Neck supple.  Cardiovascular: Normal rate and intact distal pulses.   Pulmonary/Chest: Effort normal. No stridor. No respiratory distress.  Abdominal: Normal appearance. She exhibits no distension.  Genitourinary:    Uterus is not tender. Cervix exhibits discharge (thick brown discharge on wall of vagina/cervix). Cervix exhibits no motion tenderness and no friability. Right adnexum displays no tenderness. Left adnexum displays no tenderness.  Neurological: She is alert and oriented to  person, place, and time.  Skin: Skin is warm and dry. No rash noted.  Psychiatric: She has a normal mood and affect. Her behavior is normal.  Nursing note and vitals reviewed.   ED Course  Procedures (including critical care time)  DIAGNOSTIC STUDIES: Oxygen Saturation is 100% on RA, Normal by my interpretation.    COORDINATION OF CARE: 1:59 AM- Will perform pelvic examination. Discussed treatment plan with pt at bedside and pt agreed to plan.     Labs Review Labs Reviewed  WET PREP, GENITAL - Abnormal; Notable for the following:    Yeast Wet Prep HPF POC MANY (*)    WBC, Wet Prep HPF POC FEW (*)    All other components within normal limits  POC URINE PREG, ED  GC/CHLAMYDIA PROBE AMP ([place]) NOT AT [facility]    Imaging Review No results found.   EKG Interpretation None      MDM   Final diagnoses:  Vaginal candidiasis    Wet prep shows yeast, which is consistent with exam.  Fluconazole.    I personally performed the services described in this documentation, which was scribed in my presence. The recorded information has been reviewed and is accurate.     Blake Divine, MD 04/06/15 [id number]

## 2015-04-06 NOTE — ED Notes (Signed)
Pt left with all belongings and ambulated out of treatment area.  

## 2016-08-08 IMAGING — US US OB COMP LESS 14 WK
2 series · 14 of 28 positions shown · non-contrast
Comparison: None.

CLINICAL DATA: Positive fetal heart tones. TAB in [DATE]
para 1.

EXAM:
OBSTETRIC <14 WK ULTRASOUND
TECHNIQUE: Transabdominal ultrasound was performed for evaluation of the
gestation as well as the maternal uterus and adnexal regions.

[Series 1: us ob comp less 14 wk · 0.20mm/px · 13 of 35 slices shown (1 of 2)]
[im 2/35]
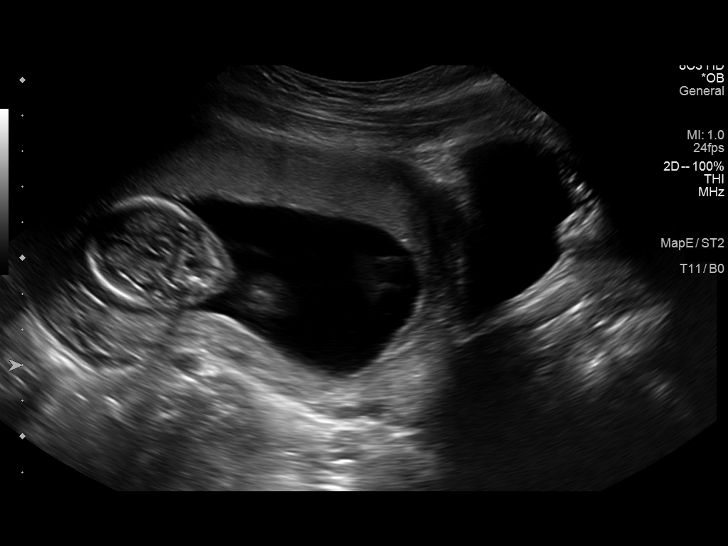
[im 4/35]
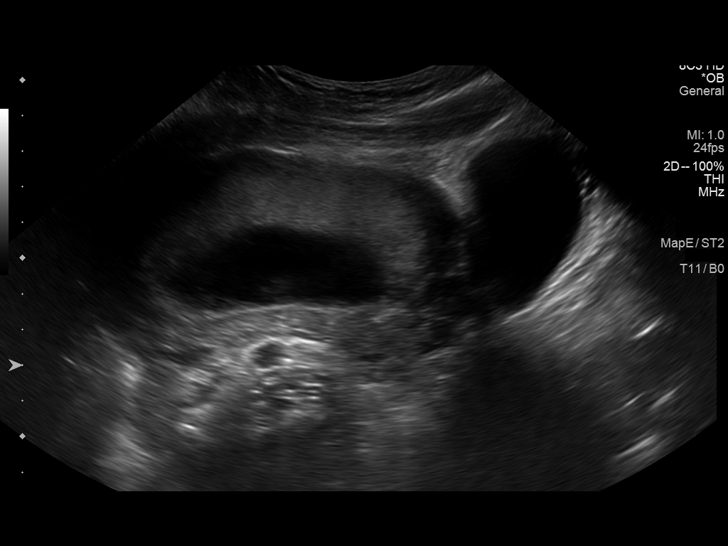
[im 7/35]
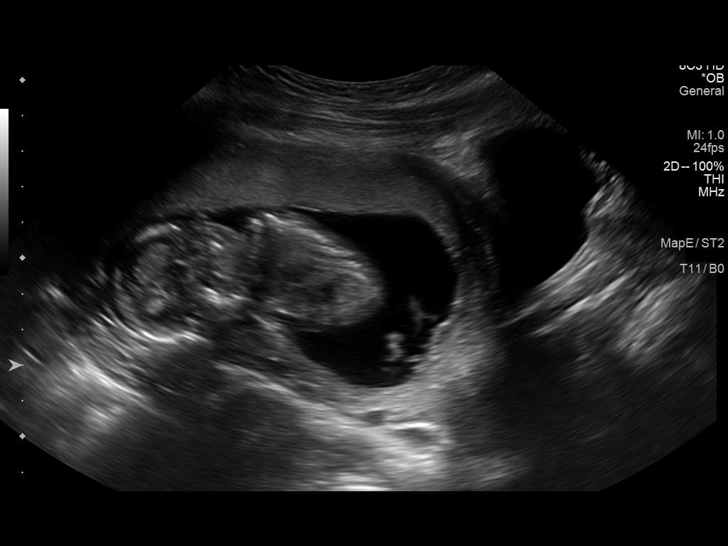
[im 10/35]
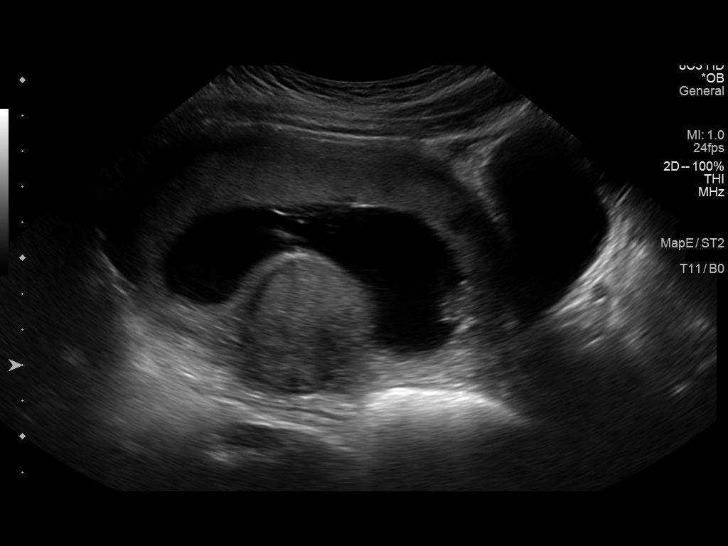
[im 12/35]
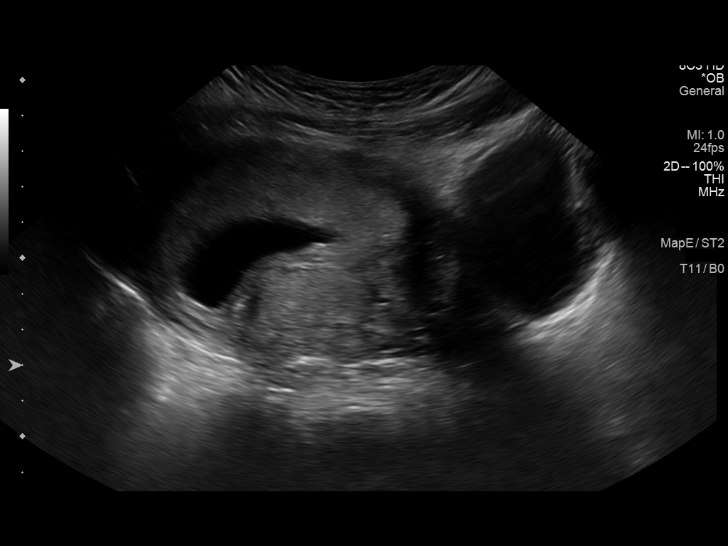
[im 15/35]
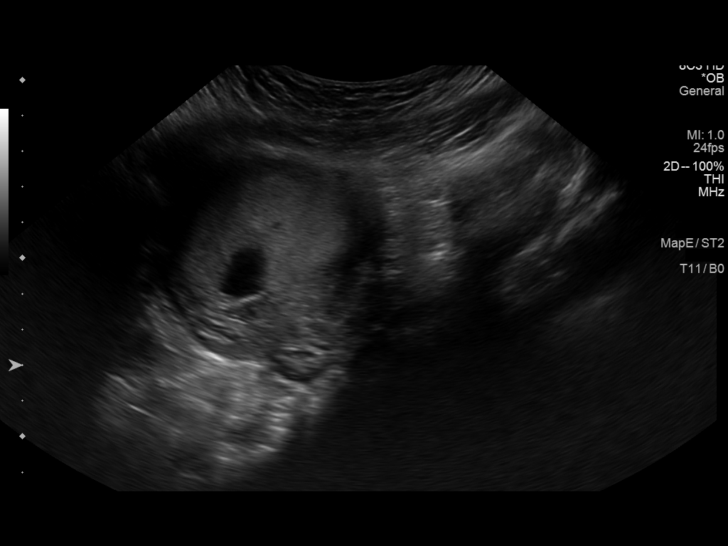
[im 18/35]
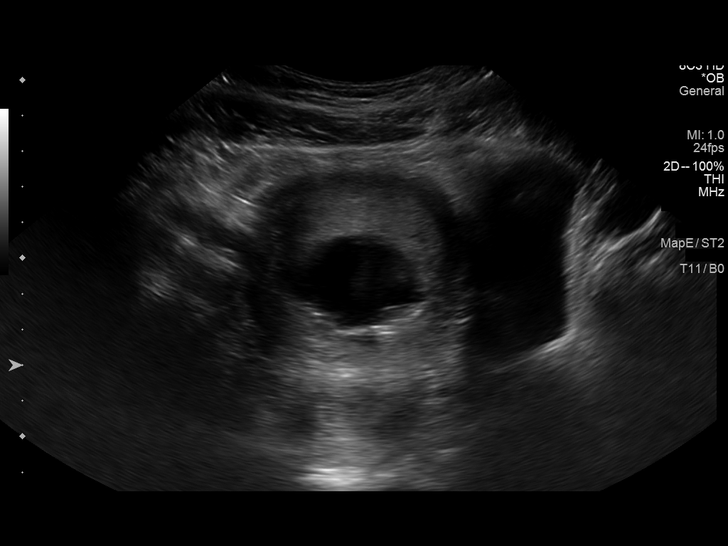
[im 20/35]
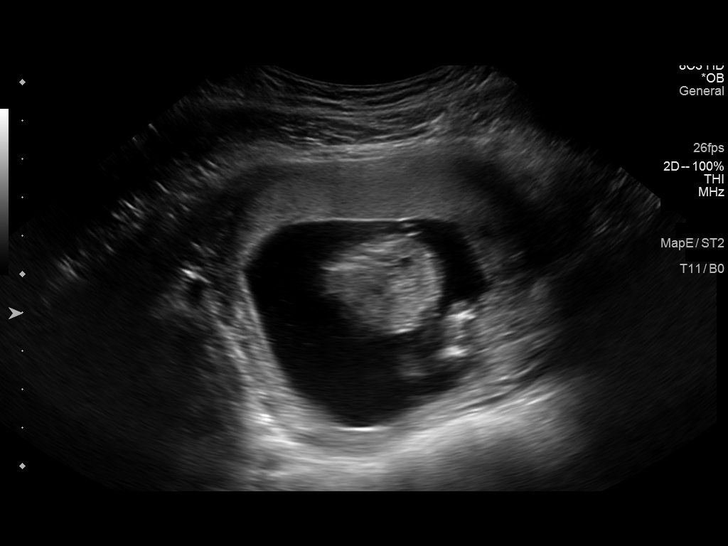
[im 23/35]
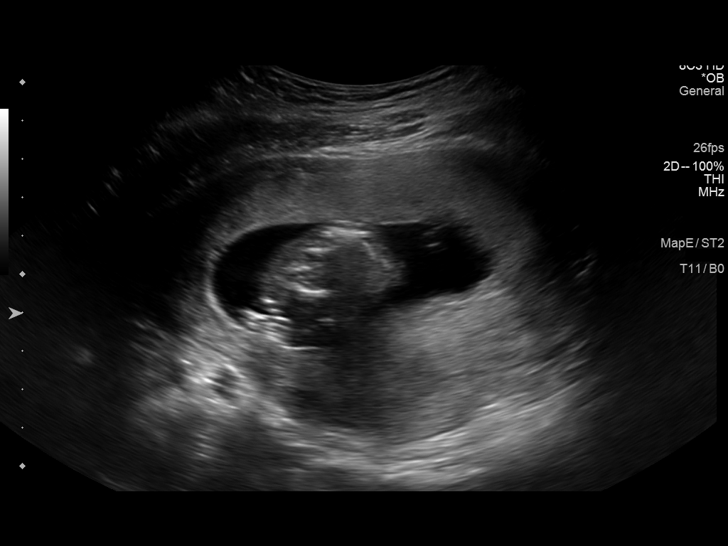
[im 25/35]
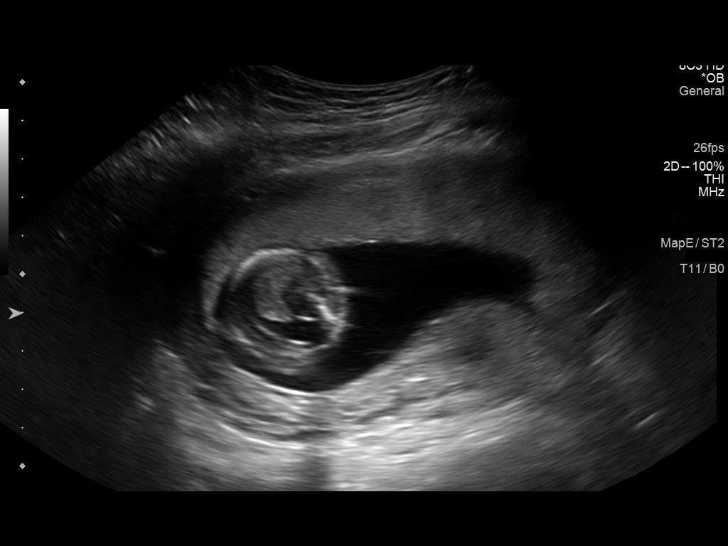
[im 28/35]
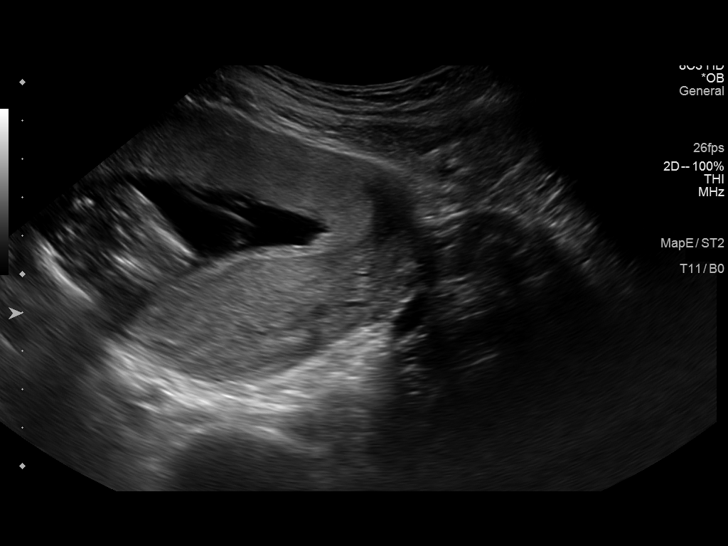
[im 31/35]
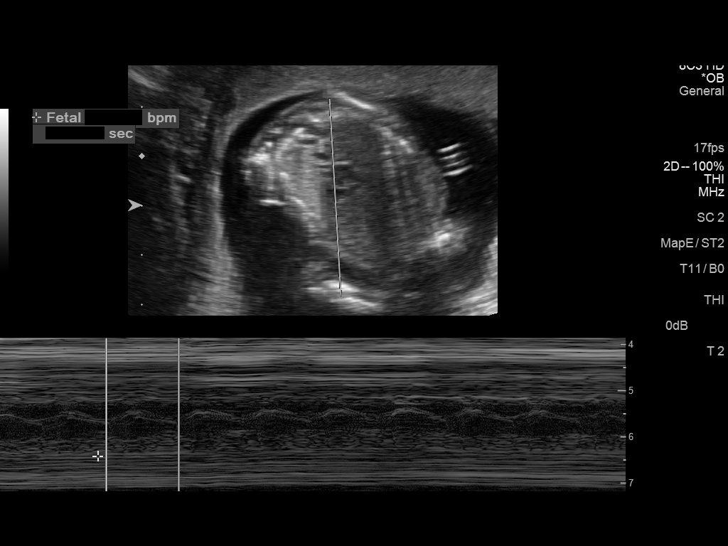
[im 33/35]
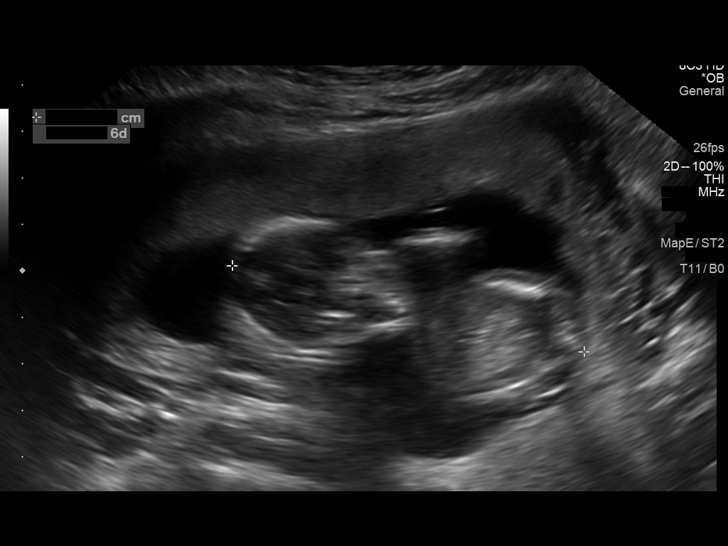

[Series 3: us ob comp less 14 wk · 0.15mm/px · 1 of 1 slices shown (2 of 2)]
[im 1/1]
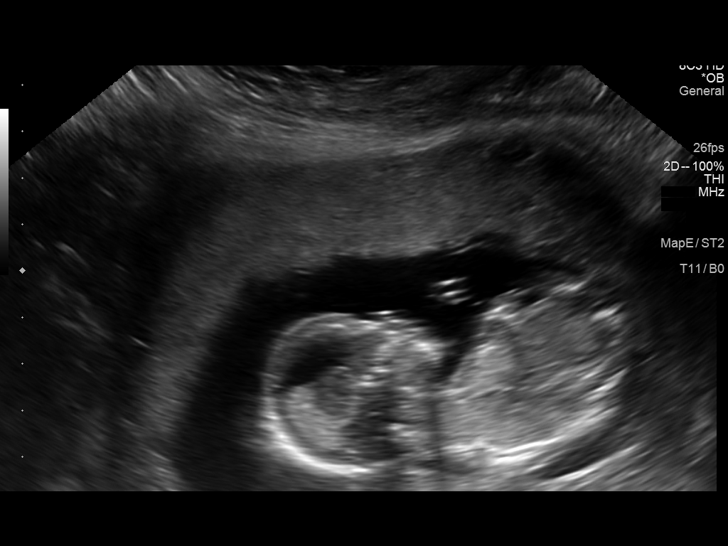

[14 of 28 positions shown; findings below may reference images not displayed]

FINDINGS: Intrauterine gestational sac: Present

Yolk sac:  Not seen

Embryo:  Present

Cardiac Activity: Present

Heart Rate: 141 bpm

CRL:   77.1  mm   13 w 5d                  US EDC: 11/13/2014

Maternal uterus/adnexae: No subchorionic hemorrhage identified. The
ovaries are not seen. No adnexal mass or free pelvic fluid.
IMPRESSION: 1. Single living intrauterine embryo at 13 weeks 5 days by
ultrasound.
2. By today's exam EDC is 11/13/2014.

## 2016-11-15 ENCOUNTER — Inpatient Hospital Stay (HOSPITAL_COMMUNITY)
Admission: AD | Admit: 2016-11-15 | Discharge: 2016-11-15 | Disposition: A | Discharge status: Home / Self Care | Payer: 59 | Source: Ambulatory Visit | Attending: Family Medicine | Admitting: Family Medicine

## 2016-11-15 ENCOUNTER — Encounter (HOSPITAL_COMMUNITY): Payer: Self-pay

## 2016-11-15 DIAGNOSIS — O23591 Infection of other part of genital tract in pregnancy, first trimester: Secondary | ICD-10-CM | POA: Diagnosis present

## 2016-11-15 DIAGNOSIS — Z202 Contact with and (suspected) exposure to infections with a predominantly sexual mode of transmission: Secondary | ICD-10-CM

## 2016-11-15 DIAGNOSIS — Z3A10 10 weeks gestation of pregnancy: Secondary | ICD-10-CM | POA: Diagnosis not present

## 2016-11-15 DIAGNOSIS — N760 Acute vaginitis: Secondary | ICD-10-CM | POA: Diagnosis not present

## 2016-11-15 DIAGNOSIS — Z79899 Other long term (current) drug therapy: Secondary | ICD-10-CM | POA: Insufficient documentation

## 2016-11-15 DIAGNOSIS — B9689 Other specified bacterial agents as the cause of diseases classified elsewhere: Secondary | ICD-10-CM | POA: Diagnosis not present

## 2016-11-15 DIAGNOSIS — O99331 Smoking (tobacco) complicating pregnancy, first trimester: Secondary | ICD-10-CM | POA: Diagnosis not present

## 2016-11-15 DIAGNOSIS — Z113 Encounter for screening for infections with a predominantly sexual mode of transmission: Secondary | ICD-10-CM

## 2016-11-15 DIAGNOSIS — F17210 Nicotine dependence, cigarettes, uncomplicated: Secondary | ICD-10-CM | POA: Insufficient documentation

## 2016-11-15 HISTORY — DX: Personal history of other infectious and parasitic diseases: Z86.19

## 2016-11-15 LAB — CBC
HEMATOCRIT: 32.3 % — AB (ref 36.0–46.0)
HEMOGLOBIN: 11.2 g/dL — AB (ref 12.0–15.0)
MCH: 31.9 pg (ref 26.0–34.0)
MCHC: 34.7 g/dL (ref 30.0–36.0)
MCV: 92 fL (ref 78.0–100.0)
Platelets: 261 10*3/uL (ref 150–400)
RBC: 3.51 MIL/uL — AB (ref 3.87–5.11)
RDW: 12.7 % (ref 11.5–15.5)
WBC: 11.9 10*3/uL — ABNORMAL HIGH (ref 4.0–10.5)

## 2016-11-15 LAB — URINALYSIS, ROUTINE W REFLEX MICROSCOPIC
BILIRUBIN URINE: NEGATIVE
GLUCOSE, UA: NEGATIVE mg/dL
Hgb urine dipstick: NEGATIVE
Ketones, ur: NEGATIVE mg/dL
Leukocytes, UA: NEGATIVE
Nitrite: NEGATIVE
Protein, ur: NEGATIVE mg/dL
Specific Gravity, Urine: 1.019 (ref 1.005–1.030)
pH: 6 (ref 5.0–8.0)

## 2016-11-15 LAB — WET PREP, GENITAL
SPERM: NONE SEEN
Trich, Wet Prep: NONE SEEN
WBC WET PREP: NONE SEEN
YEAST WET PREP: NONE SEEN

## 2016-11-15 LAB — POCT PREGNANCY, URINE: Preg Test, Ur: POSITIVE — AB

## 2016-11-15 MED ORDER — METRONIDAZOLE 500 MG PO TABS: 500.0000 mg | ORAL_TABLET | Freq: Two times a day (BID) | ORAL | 0 refills | Status: AC

## 2016-11-15 MED ORDER — AZITHROMYCIN 250 MG PO TABS
1000.0000 mg | ORAL_TABLET | Freq: Once | ORAL | Status: AC
  Administered 2016-11-15: 1000 mg via ORAL
  Filled 2016-11-15: qty 4

## 2016-11-15 NOTE — MAU Note (Signed)
Patient presents with being exposed to Chlamydia, having discharge with odor, concerned about her baby.

## 2016-11-15 NOTE — MAU Provider Note (Signed)
History     CSN: [id number]  Arrival date and time: 11/15/16 1730   First Provider Initiated Contact with Patient 11/15/16 1756      Chief Complaint  Patient presents with  . Vaginal Discharge  . Exposure to STD   HPI  Alison Jensen is a 21 y.o. G2P0010 at 641w1d who presents for STD exposure & vaginal discharge. [place]ce[place] prenatal [place] in [place] & per patient has had an ultrasound that "showed the baby's heartbeat".  States boyfriend told her today that he tested positive for chlamydia. Per patient, her & BF were treated for chlamydia 3 months ago but they didn't wait the allotted time before having intercourse again.  Has noticed vaginal discharge with foul odor for the last week. Denies abdominal pain, dysuria, vaginal bleeding, dyspareunia, or postcoital bleeding.  [place] [facility] [place] [facility]   OB History    Gravida Para Term Preterm AB Living   2       1     SAB TAB Ectopic Multiple Live Births     1            Past Medical History:  Diagnosis Date  . BV (bacterial vaginosis)   . Hx of chlamydia infection 2017 & 2018    Past Surgical History:  Procedure Laterality Date  . WISDOM TOOTH EXTRACTION      History reviewed. No pertinent family history.  Social History  Substance Use Topics  . Smoking status: Current Some Day Smoker    Packs/day: 0.50    Years: 4.00    Types: Cigarettes  . Smokeless tobacco: Never Used  . Alcohol use No    Allergies: No Known Allergies  Prescriptions Prior to Admission  Medication Sig Dispense Refill Last Dose  . azithromycin (ZITHROMAX Z-PAK) 250 MG tablet 2 po day one, then 1 daily x 4 days (Patient not taking: Reported on 04/06/2015) 5 tablet 0 Completed Course at Unknown time  . dextromethorphan-guaiFENesin (MUCINEX DM) 30-600 MG per 12 hr tablet Take 1 tablet by mouth 2 (two) times daily. (Patient not taking: Reported on 04/06/2015) 20 tablet 0 Not Taking at Unknown time  . fluconazole (DIFLUCAN) 150 MG  tablet Take 1 tablet (150 mg total) by mouth once as needed (May take in 3 days if vaginal symptoms persist.). 1 tablet 0     Review of Systems  Constitutional: Negative.   Gastrointestinal: Negative.   Genitourinary: Positive for vaginal discharge. Negative for dyspareunia, dysuria and vaginal bleeding.   Physical Exam   Blood pressure 136/79, pulse (!) 104, temperature 98.3 F (36.8 C), temperature source Oral, resp. rate 16, height 5\' 5"  (1.651 m), weight 212 lb 0.6 oz (96.2 kg), last menstrual period 09/05/2016, unknown if currently breastfeeding.  Physical Exam  Nursing note and vitals reviewed. Constitutional: She is oriented to person, place, and time. She appears well-developed and well-nourished. No distress.  HENT:  Head: Normocephalic and atraumatic.  Eyes: Conjunctivae are normal. Right eye exhibits no discharge. Left eye exhibits no discharge. No scleral icterus.  Neck: Normal range of motion.  Cardiovascular: Normal rate, regular rhythm and normal heart sounds.   No murmur heard. Respiratory: Effort normal and breath sounds normal. No respiratory distress. She has no wheezes.  GI: Soft. She exhibits no distension. There is no tenderness. There is no rebound and no guarding.  Genitourinary: Uterus is enlarged. Cervix exhibits no motion tenderness and no friability. No bleeding in the vagina. Vaginal discharge found.  Genitourinary Comments: Cervix  closed  Neurological: She is alert and oriented to person, place, and time.  Skin: Skin is warm and dry. She is not diaphoretic.  Psychiatric: She has a normal mood and affect. Her behavior is normal. Judgment and thought content normal.    MAU Course  Procedures Results for orders placed or performed during the hospital encounter of 11/15/16 (from the past 24 hour(s))  Urinalysis, Routine w reflex microscopic (not at [facility])     Status: Abnormal   Collection Time: 11/15/16  5:40 PM  Result Value Ref Range   Color, Urine  YELLOW YELLOW   APPearance HAZY (A) CLEAR   Specific Gravity, Urine 1.019 1.005 - 1.030   pH 6.0 5.0 - 8.0   Glucose, UA NEGATIVE NEGATIVE mg/dL   Hgb urine dipstick NEGATIVE NEGATIVE   Bilirubin Urine NEGATIVE NEGATIVE   Ketones, ur NEGATIVE NEGATIVE mg/dL   Protein, ur NEGATIVE NEGATIVE mg/dL   Nitrite NEGATIVE NEGATIVE   Leukocytes, UA NEGATIVE NEGATIVE  Pregnancy, urine POC     Status: Abnormal   Collection Time: 11/15/16  5:48 PM  Result Value Ref Range   Preg Test, Ur POSITIVE (A) NEGATIVE  CBC     Status: Abnormal   Collection Time: 11/15/16  6:13 PM  Result Value Ref Range   WBC 11.9 (H) 4.0 - 10.5 K/uL   RBC 3.51 (L) 3.87 - 5.11 MIL/uL   Hemoglobin 11.2 (L) 12.0 - 15.0 g/dL   HCT 16.1 (L) 09.6 - 04.5 %   MCV 92.0 78.0 - 100.0 fL   MCH 31.9 26.0 - 34.0 pg   MCHC 34.7 30.0 - 36.0 g/dL   RDW 40.9 81.1 - 91.4 %   Platelets 261 150 - 400 K/uL  Wet prep, genital     Status: Abnormal   Collection Time: 11/15/16  6:27 PM  Result Value Ref Range   Yeast Wet Prep HPF POC NONE SEEN NONE SEEN   Trich, Wet Prep NONE SEEN NONE SEEN   Clue Cells Wet Prep HPF POC PRESENT (A) NONE SEEN   WBC, Wet Prep HPF POC NONE SEEN NONE SEEN   Sperm NONE SEEN     MDM FHT 150s by doppler Azithromycin 1 gm PO to tx chlamydia CBC, HIV, RPR, GC/CT, wet prep Assessment and Plan  A; 1. BV (bacterial vaginosis)   2. Exposure to chlamydia   3. Screen for STD (sexually transmitted disease)    P: Discharge home Rx flagyl Discussed reasons to return to MAU Keep follow up appointment with OB/PCP  HIV, RPR, & GC/CT pending No intercourse x 1 week after treatment   Alison Jensen 11/15/2016, 5:55 PM

## 2016-11-15 NOTE — Discharge Instructions (Signed)
Bacterial Vaginosis Bacterial vaginosis is an infection of the vagina. It happens when too many germs (bacteria) grow in the vagina. This infection puts you at risk for infections from sex (STIs). Treating this infection can lower your risk for some STIs. You should also treat this if you are pregnant. It can cause your baby to be born early. Follow these instructions at home: Medicines   Take over-the-counter and prescription medicines only as told by your doctor.  Take or use your antibiotic medicine as told by your doctor. Do not stop taking or using it even if you start to feel better. General instructions   If you your sexual partner is a woman, tell her that you have this infection. She needs to get treatment if she has symptoms. If you have a female partner, he does not need to be treated.  During treatment:  Avoid sex.  Do not douche.  Avoid alcohol as told.  Avoid breastfeeding as told.  Drink enough fluid to keep your pee (urine) clear or pale yellow.  Keep your vagina and butt (rectum) clean.  Wash the area with warm water every day.  Wipe from front to back after you use the toilet.  Keep all follow-up visits as told by your doctor. This is important. Preventing this condition   Do not douche.  Use only warm water to wash around your vagina.  Use protection when you have sex. This includes:  Latex condoms.  Dental dams.  Limit how many people you have sex with. It is best to only have sex with the same person (be monogamous).  Get tested for STIs. Have your partner get tested.  Wear underwear that is cotton or lined with cotton.  Avoid tight pants and pantyhose. This is most important in summer.  Do not use any products that have nicotine or tobacco in them. These include cigarettes and e-cigarettes. If you need help quitting, ask your doctor.  Do not use illegal drugs.  Limit how much alcohol you drink. Contact a doctor if:  Your symptoms do not  get better, even after you are treated.  You have more discharge or pain when you pee (urinate).  You have a fever.  You have pain in your belly (abdomen).  You have pain with sex.  Your bleed from your vagina between periods. Summary  This infection happens when too many germs (bacteria) grow in the vagina.  Treating this condition can lower your risk for some infections from sex (STIs).  You should also treat this if you are pregnant. It can cause early (premature) birth.  Do not stop taking or using your antibiotic medicine even if you start to feel better. This information is not intended to replace advice given to you by your health care provider. Make sure you discuss any questions you have with your health care provider. Document Released: 05/28/2008 Document Revised: 05/04/2016 Document Reviewed: 05/04/2016 Elsevier Interactive Patient Education  2017 [employer]  Chlamydia, Female Chlamydia is an STD (sexually transmitted disease). This is an infection that spreads through sexual contact. If it is not treated, it can cause serious problems. It must be treated with antibiotic medicine. Sometimes, you may not have symptoms (asymptomatic). When you have symptoms, they can include:  Burning when you pee (urinate).  Peeing often.  Fluid (discharge) coming from the vagina.  Redness, soreness, and swelling (inflammation) of the butt (rectum).  Bleeding or fluid coming from the butt.  Belly (abdominal) pain.  Pain during sex.  Bleeding between periods.  Itching, burning, or redness in the eyes.  Fluid coming from the eyes. Follow these instructions at home: Medicines   Take over-the-counter and prescription medicines only as told by your doctor.  Take your antibiotic medicine as told by your doctor. Do not stop taking the antibiotic even if you start to feel better. Sexual activity   Tell sex partners about your infection. Sex partners are people you had  oral, anal, or vaginal sex with within 60 days of when you started getting sick. They need treatment, too.  Do not have sex until:  You and your sex partners have been treated.  Your doctor says it is okay.  If you have a single dose treatment, wait 7 days before having sex. General instructions   It is up to you to get your test results. Ask your doctor when your results will be ready.  Get a lot of rest.  Eat healthy foods.  Drink enough fluid to keep your pee (urine) clear or pale yellow.  Keep all follow-up visits as told by your doctor. You may need tests after 3 months. Preventing chlamydia   The only way to prevent chlamydia is not to have sex. To lower your risk:  Use latex condoms correctly. Do this every time you have sex.  Avoid having many sex partners.  Ask if your partner has been tested for STDs and if he or she had negative results. Contact a doctor if:  You get new symptoms.  You do not get better with treatment.  You have a fever or chills.  You have pain during sex. Get help right away if:  Your pain gets worse and does not get better with medicine.  You get flu-like symptoms, such as:  Night sweats.  Sore throat.  Muscle aches.  You feel sick to your stomach (nauseous).  You throw up (vomit).  You have trouble swallowing.  You have bleeding:  Between periods.  After sex.  You have irregular periods.  You have belly pain that does not get better with medicine.  You have lower back pain that does not get better with medicine.  You feel weak or dizzy.  You pass out (faint).  You are pregnant and you get symptoms of chlamydia. Summary  Chlamydia is an infection that spreads through sexual contact.  Sometimes, chlamydia can cause no symptoms (asymptomatic).  Do not have sex until your doctor says it is okay.  All sex partners will have to be treated for chlamydia. This information is not intended to replace advice given  to you by your health care provider. Make sure you discuss any questions you have with your health care provider. Document Released: 05/28/2008 Document Revised: 08/08/2016 Document Reviewed: 08/08/2016 Elsevier Interactive Patient Education  2017 [employer].           [facility] Prenatal Care Providers   [facility]       Phone: [phone]  [facility] [place] Phone: [phone]  [facility] [employer] [facility] [place]  Phone: [phone]  [facility] at [facility]  Phone: [phone]  [facility] [place]  Phone: [phone]  [facility]       Phone: [phone]  [facility]    Phone: [phone]   [facility] [place])    Phone: [phone]  [facility]    Phone: [phone]  [facility]    Phone: [phone]   [facility]  Phone: [phone]  [facility]    Phone: [phone]  [facility]   Phone: [phone]  [facility]    Phone: [phone]

## 2016-11-16 LAB — HIV ANTIBODY (ROUTINE TESTING W REFLEX): HIV SCREEN 4TH GENERATION: NONREACTIVE

## 2016-11-16 LAB — RPR: RPR: NONREACTIVE

## 2016-11-18 LAB — GC/CHLAMYDIA PROBE AMP (~~LOC~~) NOT AT ARMC
Chlamydia: NEGATIVE
NEISSERIA GONORRHEA: NEGATIVE

## 2017-02-15 ENCOUNTER — Inpatient Hospital Stay (HOSPITAL_COMMUNITY)
Admission: AD | Admit: 2017-02-15 | Discharge: 2017-02-15 | Disposition: A | Discharge status: Home / Self Care | Payer: Medicaid Other | Source: Ambulatory Visit | Attending: Obstetrics and Gynecology | Admitting: Obstetrics and Gynecology

## 2017-02-15 DIAGNOSIS — S61239A Puncture wound without foreign body of unspecified finger without damage to nail, initial encounter: Secondary | ICD-10-CM | POA: Insufficient documentation

## 2017-02-15 DIAGNOSIS — W273XXA Contact with needle (sewing), initial encounter: Secondary | ICD-10-CM

## 2017-02-15 DIAGNOSIS — W268XXA Contact with other sharp object(s), not elsewhere classified, initial encounter: Secondary | ICD-10-CM | POA: Diagnosis not present

## 2017-02-15 DIAGNOSIS — IMO0001 Reserved for inherently not codable concepts without codable children: Secondary | ICD-10-CM

## 2017-02-15 NOTE — MAU Note (Signed)
Pt presents to MAU stating that she stuck herself with a dirty needle at her client's home and wants to check and see if her baby is ok. Zerita Boersarlene Lawson CNM in with pt discussing plan of care and the need to call her employer to follow protocol for needle sticks. Pt verbalized understanding and discharged

## 2017-02-15 NOTE — MAU Provider Note (Signed)
[id number] in with c/o needle stick. 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 Lengthy discussion with pt regarding proper procedure to monitor needle sticks and needed labs. Pt does not have any complaints. Pt declines care and will contact her supervisor in regards to screening labs and appropriate follow up for screenings. Pt d/c home.

## 2017-09-04 ENCOUNTER — Encounter (HOSPITAL_COMMUNITY): Payer: Self-pay
